# Patient Record
Sex: Female | Born: 1965 | Race: White | Hispanic: No | Smoking: Current every day smoker
Health system: Southern US, Community
[De-identification: ages and names within clinical notes are randomized; demographics above are authoritative.]

---

## 2005-06-25 ENCOUNTER — Emergency Department (HOSPITAL_COMMUNITY): Admission: EM | Admit: 2005-06-25 | Discharge: 2005-06-25 | Payer: Self-pay | Admitting: Emergency Medicine

## 2005-07-01 ENCOUNTER — Emergency Department (HOSPITAL_COMMUNITY): Admission: EM | Admit: 2005-07-01 | Discharge: 2005-07-01 | Payer: Self-pay | Admitting: Emergency Medicine

## 2011-02-19 ENCOUNTER — Emergency Department (HOSPITAL_COMMUNITY): Payer: Self-pay

## 2011-02-19 ENCOUNTER — Emergency Department (HOSPITAL_COMMUNITY)
Admission: EM | Admit: 2011-02-19 | Discharge: 2011-02-20 | Disposition: A | Discharge status: Home / Self Care | Payer: Self-pay | Attending: Emergency Medicine | Admitting: Emergency Medicine

## 2011-02-19 DIAGNOSIS — W268XXA Contact with other sharp object(s), not elsewhere classified, initial encounter: Secondary | ICD-10-CM | POA: Insufficient documentation

## 2011-02-19 DIAGNOSIS — S0990XA Unspecified injury of head, initial encounter: Secondary | ICD-10-CM | POA: Insufficient documentation

## 2011-02-19 DIAGNOSIS — S61409A Unspecified open wound of unspecified hand, initial encounter: Secondary | ICD-10-CM | POA: Insufficient documentation

## 2011-02-19 DIAGNOSIS — W01119A Fall on same level from slipping, tripping and stumbling with subsequent striking against unspecified sharp object, initial encounter: Secondary | ICD-10-CM | POA: Insufficient documentation

## 2011-02-19 LAB — POCT I-STAT, CHEM 8
BUN: 24 mg/dL — ABNORMAL HIGH (ref 6–23)
Creatinine, Ser: 1 mg/dL (ref 0.4–1.2)
Hemoglobin: 13.3 g/dL (ref 12.0–15.0)
Potassium: 3.1 mEq/L — ABNORMAL LOW (ref 3.5–5.1)
Sodium: 139 mEq/L (ref 135–145)
TCO2: 21 mmol/L (ref 0–100)

## 2011-02-20 ENCOUNTER — Emergency Department (HOSPITAL_COMMUNITY): Payer: Self-pay

## 2011-03-02 ENCOUNTER — Inpatient Hospital Stay (INDEPENDENT_AMBULATORY_CARE_PROVIDER_SITE_OTHER)
Admission: RE | Admit: 2011-03-02 | Discharge: 2011-03-02 | Disposition: A | Discharge status: Home / Self Care | Payer: Self-pay | Source: Ambulatory Visit

## 2011-03-02 DIAGNOSIS — T148XXA Other injury of unspecified body region, initial encounter: Secondary | ICD-10-CM

## 2011-03-02 DIAGNOSIS — S61409A Unspecified open wound of unspecified hand, initial encounter: Secondary | ICD-10-CM

## 2011-03-05 LAB — WOUND CULTURE: Gram Stain: NONE SEEN

## 2011-04-23 ENCOUNTER — Inpatient Hospital Stay (INDEPENDENT_AMBULATORY_CARE_PROVIDER_SITE_OTHER)
Admission: RE | Admit: 2011-04-23 | Discharge: 2011-04-23 | Disposition: A | Discharge status: Home / Self Care | Payer: Self-pay | Source: Ambulatory Visit | Attending: Family Medicine | Admitting: Family Medicine

## 2011-04-23 DIAGNOSIS — N390 Urinary tract infection, site not specified: Secondary | ICD-10-CM

## 2011-04-23 LAB — WET PREP, GENITAL

## 2011-04-23 LAB — POCT URINALYSIS DIP (DEVICE)
Ketones, ur: NEGATIVE mg/dL
Protein, ur: 100 mg/dL — AB
Specific Gravity, Urine: 1.015 (ref 1.005–1.030)
Urobilinogen, UA: 1 mg/dL (ref 0.0–1.0)
pH: 7 (ref 5.0–8.0)

## 2011-04-25 LAB — GC/CHLAMYDIA PROBE AMP, GENITAL
Chlamydia, DNA Probe: NEGATIVE
GC Probe Amp, Genital: NEGATIVE

## 2018-03-05 ENCOUNTER — Encounter: Payer: Self-pay | Admitting: Pediatric Intensive Care

## 2018-03-05 DIAGNOSIS — E139 Other specified diabetes mellitus without complications: Secondary | ICD-10-CM

## 2018-03-05 LAB — GLUCOSE, POCT (MANUAL RESULT ENTRY): POC GLUCOSE: 86 mg/dL (ref 70–99)

## 2018-03-29 NOTE — Congregational Nurse Program (Signed)
Congregational Nurse Program Note  Date of Encounter: 03/05/2018  Past Medical History: No past medical history on file.  Encounter Details: CNP Questionnaire - 03/05/18 1200      Questionnaire   Patient Status  Not Applicable    Race  Hispanic or Latino    Location Patient Served At  [employer]    Uninsured  Uninsured (NEW 1x/quarter)    Food  No food insecurities    Housing/Utilities  Yes, have permanent housing    Transportation  No transportation needs    Interpersonal Safety  Yes, feel physically and emotionally safe where you currently live    Medication  No medication insecurities    Medical Provider  No    Referrals  Not Applicable    ED Visit Averted  Not Applicable    Life-Saving Intervention Made  Not Applicable      Requests BP/BG check. States family histpry of diabetes and hypertension but no personal history.

## 2019-06-06 ENCOUNTER — Other Ambulatory Visit: Payer: Self-pay

## 2019-06-06 ENCOUNTER — Ambulatory Visit (HOSPITAL_COMMUNITY)
Admission: EM | Admit: 2019-06-06 | Discharge: 2019-06-06 | Disposition: A | Discharge status: Home / Self Care | Payer: Self-pay | Attending: Internal Medicine | Admitting: Internal Medicine

## 2019-06-06 ENCOUNTER — Encounter (HOSPITAL_COMMUNITY): Payer: Self-pay | Admitting: Emergency Medicine

## 2019-06-06 DIAGNOSIS — R509 Fever, unspecified: Secondary | ICD-10-CM

## 2019-06-06 DIAGNOSIS — M791 Myalgia, unspecified site: Secondary | ICD-10-CM

## 2019-06-06 DIAGNOSIS — Z20828 Contact with and (suspected) exposure to other viral communicable diseases: Secondary | ICD-10-CM | POA: Insufficient documentation

## 2019-06-06 DIAGNOSIS — Z20822 Contact with and (suspected) exposure to covid-19: Secondary | ICD-10-CM

## 2019-06-06 DIAGNOSIS — R059 Cough, unspecified: Secondary | ICD-10-CM

## 2019-06-06 DIAGNOSIS — F17200 Nicotine dependence, unspecified, uncomplicated: Secondary | ICD-10-CM | POA: Insufficient documentation

## 2019-06-06 DIAGNOSIS — R05 Cough: Secondary | ICD-10-CM | POA: Insufficient documentation

## 2019-06-06 MED ORDER — BENZONATATE 100 MG PO CAPS: 100.0000 mg | ORAL_CAPSULE | Freq: Three times a day (TID) | ORAL | 0 refills | Status: AC | PRN

## 2019-06-06 MED ORDER — ALBUTEROL SULFATE HFA 108 (90 BASE) MCG/ACT IN AERS: 1.0000 | INHALATION_SPRAY | Freq: Four times a day (QID) | RESPIRATORY_TRACT | 0 refills | Status: AC | PRN

## 2019-06-06 MED ORDER — BENZONATATE 100 MG PO CAPS: 100.0000 mg | ORAL_CAPSULE | Freq: Three times a day (TID) | ORAL | 0 refills | Status: DC

## 2019-06-06 NOTE — ED Triage Notes (Signed)
Pt sts body aches x several days worse today; pt admits to last crack cocaine use 3 days ago; pt tearful

## 2019-06-06 NOTE — ED Provider Notes (Signed)
[place]    CSN: [id number] Arrival date & time: 06/06/19  1815      History   Chief Complaint Chief Complaint  Patient presents with  . Generalized Body Aches    HPI Jessica Flynn is a 53 y.o. female with no past medical history comes in with nonproductive cough, generalized body aches of several days duration.  Patient has subjective fever and chills.  She admits to having intermittent wheezing.  Cough was previously productive of some greenish sputum but that has resolved at this time.  She denies any shortness of breath, nausea or vomiting.  Patient denies any relieving factors.  She has not tried any over-the-counter medication.  She uses street drugs and use crack cocaine/marijuana 3 days ago. HPI  History reviewed. No pertinent past medical history.  There are no active problems to display for this patient.   History reviewed. No pertinent surgical history.  OB History   No obstetric history on file.      Home Medications    Prior to Admission medications   Medication Sig Start Date End Date Taking? Authorizing Provider  albuterol (VENTOLIN HFA) 108 (90 Base) MCG/ACT inhaler Inhale 1-2 puffs into the lungs every 6 (six) hours as needed for wheezing or shortness of breath. 06/06/19   Walid Haig, Myrene Galas, MD  benzonatate (TESSALON) 100 MG capsule Take 1 capsule (100 mg total) by mouth 3 (three) times daily as needed for cough. 06/06/19   Klint Lezcano, Myrene Galas, MD    Family History Family History  Family history unknown: Yes    Social History Social History   Tobacco Use  . Smoking status: Current Every Day Smoker  . Smokeless tobacco: Never Used  Substance Use Topics  . Alcohol use: Yes  . Drug use: Yes    Types: Cocaine, Marijuana     Allergies   Patient has no known allergies.   Review of Systems Review of Systems  Constitutional: Positive for activity change, chills, fatigue and fever. Negative for appetite change and unexpected weight  change.  HENT: Negative for congestion, facial swelling, hearing loss, mouth sores, postnasal drip, rhinorrhea, sneezing and sore throat.   Respiratory: Positive for cough, chest tightness and wheezing. Negative for shortness of breath.   Gastrointestinal: Negative.   Genitourinary: Negative.   Musculoskeletal: Positive for arthralgias and myalgias. Negative for back pain, gait problem and joint swelling.  Skin: Negative.   Neurological: Positive for headaches. Negative for dizziness and weakness.     Physical Exam Triage Vital Signs ED Triage Vitals  Enc Vitals Group     BP 06/06/19 1854 (!) 144/107     Pulse Rate 06/06/19 1854 88     Resp 06/06/19 1854 18     Temp 06/06/19 1854 98.5 F (36.9 C)     Temp Source 06/06/19 1854 Oral     SpO2 06/06/19 1854 100 %     Weight --      Height --      Head Circumference --      Peak Flow --      Pain Score 06/06/19 1855 7     Pain Loc --      Pain Edu? --      Excl. in [place]? --    No data found.  Updated Vital Signs BP (!) 144/107 (BP Location: Right Arm)   Pulse 88   Temp 98.5 F (36.9 C) (Oral)   Resp 18   SpO2 100%   Visual Acuity Right  Eye Distance:   Left Eye Distance:   Bilateral Distance:    Right Eye Near:   Left Eye Near:    Bilateral Near:     Physical Exam Constitutional:      General: She is in acute distress.     Appearance: She is ill-appearing. She is not toxic-appearing.  HENT:     Mouth/Throat:     Mouth: Mucous membranes are moist.  Cardiovascular:     Rate and Rhythm: Normal rate and regular rhythm.     Pulses: Normal pulses.     Heart sounds: Normal heart sounds.  Pulmonary:     Effort: Pulmonary effort is normal. No respiratory distress.     Breath sounds: Normal breath sounds. No wheezing, rhonchi or rales.  Abdominal:     General: Bowel sounds are normal.     Palpations: Abdomen is soft.  Musculoskeletal: Normal range of motion.  Skin:    General: Skin is warm.     Capillary Refill:  Capillary refill takes less than 2 seconds.  Neurological:     General: No focal deficit present.     Mental Status: She is alert.      UC Treatments / Results  Labs (all labs ordered are listed, but only abnormal results are displayed) Labs Reviewed - No data to display  EKG   Radiology No results found.  Procedures Procedures (including critical care time)  Medications Ordered in UC Medications - No data to display  Initial Impression / Assessment and Plan / UC Course  I have reviewed the triage vital signs and the nursing notes.  Pertinent labs & imaging results that were available during my care of the patient were reviewed by me and considered in my medical decision making (see chart for details).     1.  Flulike symptoms: COVID-19 testing completed Patient is advised to self isolate Tessalon Perles as needed for cough Tylenol/Motrin as needed for body aches  2.  Chronic tobacco use with suspected COPD: Albuterol inhaler as needed Smoke cessation advice given Patient is in the pre-contemplative state of tobacco cessation  3.  Illicit drug use: Patient was counseled about the dangers of using street drugs. Final Clinical Impressions(s) / UC Diagnoses   Final diagnoses:  Suspected Covid-19 Virus Infection  Cough   Discharge Instructions   None    ED Prescriptions    Medication Sig Dispense Auth. Provider   albuterol (VENTOLIN HFA) 108 (90 Base) MCG/ACT inhaler Inhale 1-2 puffs into the lungs every 6 (six) hours as needed for wheezing or shortness of breath. 6.7 g Brittan Butterbaugh, Britta MccreedyPhilip O, MD   benzonatate (TESSALON) 100 MG capsule  (Status: Discontinued) Take 1 capsule (100 mg total) by mouth every 8 (eight) hours. 21 capsule Solangel Mcmanaway, Britta MccreedyPhilip O, MD   benzonatate (TESSALON) 100 MG capsule Take 1 capsule (100 mg total) by mouth 3 (three) times daily as needed for cough. 21 capsule Shivali Quackenbush, Britta MccreedyPhilip O, MD     Controlled Substance Prescriptions [place] Controlled  Substance Registry consulted? No   Merrilee JanskyLamptey, Aleigh Grunden O, MD 06/06/19 2036

## 2019-06-09 LAB — NOVEL CORONAVIRUS, NAA (HOSP ORDER, SEND-OUT TO REF LAB; TAT 18-24 HRS): SARS-CoV-2, NAA: NOT DETECTED

## 2020-02-20 ENCOUNTER — Ambulatory Visit: Payer: Self-pay | Attending: Internal Medicine

## 2020-02-20 DIAGNOSIS — Z23 Encounter for immunization: Secondary | ICD-10-CM

## 2020-02-20 NOTE — Progress Notes (Signed)
   Covid-19 Vaccination Clinic  Name:  Xylina Rhoads    MRN: [id number] DOB: [birth year]  02/20/2020  Ms. Seki was observed post Covid-19 immunization for 15 minutes without incident. She was provided with Vaccine Information Sheet and instruction to access the V-Safe system.   Ms. Zia was instructed to call 911 with any severe reactions post vaccine: Marland Kitchen Difficulty breathing  . Swelling of face and throat  . A fast heartbeat  . A bad rash all over body  . Dizziness and weakness   Immunizations Administered    Name Date Dose VIS Date Route   Moderna COVID-19 Vaccine 02/20/2020 10:05 AM 0.5 mL 10/22/2019 Intramuscular   Manufacturer: Moderna   Lot: [id number]   NDC: 98421-031-28

## 2020-03-24 ENCOUNTER — Ambulatory Visit: Payer: Self-pay | Attending: Family

## 2020-03-24 DIAGNOSIS — Z23 Encounter for immunization: Secondary | ICD-10-CM

## 2020-03-24 NOTE — Progress Notes (Signed)
   Covid-19 Vaccination Clinic  Name:  Jessica Flynn    MRN: [id number] DOB: 1966/08/04  03/24/2020  [name] was observed post Covid-19 immunization for 15 minutes without incident. She was provided with Vaccine Information Sheet and instruction to access the V-Safe system.   [name] was instructed to call 911 with any severe reactions post vaccine: Marland Kitchen Difficulty breathing  . Swelling of face and throat  . A fast heartbeat  . A bad rash all over body  . Dizziness and weakness   Immunizations Administered    Name Date Dose VIS Date Route   Moderna COVID-19 Vaccine 03/24/2020 11:01 AM 0.5 mL 10/2019 Intramuscular   Manufacturer: Moderna   Lot: [id number]   NDC: 83338-329-19

## 2021-07-23 ENCOUNTER — Encounter (HOSPITAL_COMMUNITY): Payer: Self-pay

## 2021-07-23 ENCOUNTER — Emergency Department (HOSPITAL_COMMUNITY)
Admission: EM | Admit: 2021-07-23 | Discharge: 2021-07-23 | Disposition: A | Discharge status: Home / Self Care | Payer: Self-pay | Attending: Emergency Medicine | Admitting: Emergency Medicine

## 2021-07-23 DIAGNOSIS — L03115 Cellulitis of right lower limb: Secondary | ICD-10-CM

## 2021-07-23 DIAGNOSIS — R2241 Localized swelling, mass and lump, right lower limb: Secondary | ICD-10-CM | POA: Insufficient documentation

## 2021-07-23 DIAGNOSIS — F17210 Nicotine dependence, cigarettes, uncomplicated: Secondary | ICD-10-CM | POA: Insufficient documentation

## 2021-07-23 MED ORDER — DOXYCYCLINE HYCLATE 100 MG PO CAPS: 100.0000 mg | ORAL_CAPSULE | Freq: Two times a day (BID) | ORAL | 0 refills | Status: AC

## 2021-07-23 MED ORDER — DOXYCYCLINE HYCLATE 100 MG PO TABS
100.0000 mg | ORAL_TABLET | Freq: Once | ORAL | Status: AC
  Administered 2021-07-23: 100 mg via ORAL
  Filled 2021-07-23: qty 1

## 2021-07-23 NOTE — ED Triage Notes (Signed)
Pt BIB [facility] from home c/o possible spider bite on R knee 2 days ago. Reports increased swelling, pain, difficulty walking.   BP 138/78 HR 76 SpO2 98% RA Temp 98.4

## 2021-07-23 NOTE — Discharge Instructions (Addendum)
You have cellulitis of the right knee.  Take doxycycline twice daily for the next 10 days.  Start taking it this evening were given the first dose here in the ED today.  If the redness spreads past the outline we drew please return back to the ED for additional evaluation.  Please also set up an appointment to follow-up with a primary care physician.  If you attach information to the discharge paperwork.

## 2021-07-23 NOTE — ED Provider Notes (Signed)
[facility]-EMERGENCY DEPT Provider Note   CSN: [id number] Arrival date & time: 07/23/21  1194     History Chief Complaint  Patient presents with   Insect Bite    Jessica Flynn is a 55 y.o. female.  HPI  Patient presents with swelling to the right knee.  States that she was sitting on the front porch yesterday and noticed a spider crawling on her leg.  She felt a stinging pressure on her knee.  States that it is been increasingly swelling, red, and painful.  The redness is spreading down the anterior surface of her shin.  She does not have any fevers at home.  She has not tried any alleviating factors.  Walking is an aggravating factor.  History reviewed. No pertinent past medical history.  There are no problems to display for this patient.   History reviewed. No pertinent surgical history.   OB History   No obstetric history on file.     Family History  Family history unknown: Yes    Social History   Tobacco Use   Smoking status: Every Day    Packs/day: 1.00    Years: 40.00    Pack years: 40.00    Types: Cigarettes   Smokeless tobacco: Never  Substance Use Topics   Alcohol use: Not Currently   Drug use: Not Currently    Types: Cocaine, Marijuana    Comment: clean for 4-5 months    Home Medications Prior to Admission medications   Medication Sig Start Date End Date Taking? Authorizing Provider  albuterol (VENTOLIN HFA) 108 (90 Base) MCG/ACT inhaler Inhale 1-2 puffs into the lungs every 6 (six) hours as needed for wheezing or shortness of breath. 06/06/19   Lamptey, Britta Mccreedy, MD  benzonatate (TESSALON) 100 MG capsule Take 1 capsule (100 mg total) by mouth 3 (three) times daily as needed for cough. 06/06/19   Lamptey, Britta Mccreedy, MD    Allergies    Patient has no known allergies.  Review of Systems   Review of Systems  Constitutional:  Negative for fatigue and fever.  Musculoskeletal:  Positive for arthralgias.  Skin:  Positive for color  change and rash.   Physical Exam Updated Vital Signs Ht 5\' 1"  (1.549 m)   Wt 49.9 kg   BMI 20.78 kg/m   Physical Exam Vitals and nursing note reviewed. Exam conducted with a chaperone present.  Constitutional:      General: She is not in acute distress.    Appearance: Normal appearance.  HENT:     Head: Normocephalic and atraumatic.  Eyes:     General: No scleral icterus.    Extraocular Movements: Extraocular movements intact.     Pupils: Pupils are equal, round, and reactive to light.  Cardiovascular:     Rate and Rhythm: Normal rate and regular rhythm.     Comments: DP and PT are 2+ bilaterally Musculoskeletal:        General: Tenderness present. Normal range of motion.     Comments: Range of motion fully intact passive flexion extension.    Skin:    Coloration: Skin is not jaundiced.     Comments: Please see attached photo.  Patient has warmth, erythema, mild swelling to the right leg and right knee.  Neurological:     General: No focal deficit present.     Mental Status: She is alert. Mental status is at baseline.     Coordination: Coordination normal.     ED Results /  Procedures / Treatments   Labs (all labs ordered are listed, but only abnormal results are displayed) Labs Reviewed - No data to display  EKG None  Radiology No results found.  Procedures Procedures   Medications Ordered in ED Medications - No data to display  ED Course  I have reviewed the triage vital signs and the nursing notes.  Pertinent labs & imaging results that were available during my care of the patient were reviewed by me and considered in my medical decision making (see chart for details).    MDM Rules/Calculators/A&P                           Patient vitals are stable, she is neurovascularly intact to the right leg.  Physical exam findings are consistent with cellulitis.  No posterior calf tenderness, physical exam is also not consistent with a DVT.  We will start the  patient on doxycycline twice daily for the next 10 days.  First dose given here in the ED setting, patient also given primary care doctor referral as she does not currently have one.  Strict return precautions were given, erythema was delineated circumferentially with a pen so she is able to track its improvement or worsening.  Final Clinical Impression(s) / ED Diagnoses Final diagnoses:  None    Rx / DC Orders ED Discharge Orders     None        Theron Arista, PA-C 07/23/21 1194    Virgina Norfolk, DO 07/23/21 1012

## 2021-11-13 ENCOUNTER — Emergency Department (HOSPITAL_COMMUNITY): Payer: Self-pay

## 2021-11-13 ENCOUNTER — Emergency Department (HOSPITAL_COMMUNITY)
Admission: EM | Admit: 2021-11-13 | Discharge: 2021-11-13 | Disposition: A | Discharge status: Home / Self Care | Payer: Self-pay | Attending: Emergency Medicine | Admitting: Emergency Medicine

## 2021-11-13 DIAGNOSIS — F17210 Nicotine dependence, cigarettes, uncomplicated: Secondary | ICD-10-CM | POA: Insufficient documentation

## 2021-11-13 DIAGNOSIS — S0003XA Contusion of scalp, initial encounter: Secondary | ICD-10-CM

## 2021-11-13 DIAGNOSIS — Y92009 Unspecified place in unspecified non-institutional (private) residence as the place of occurrence of the external cause: Secondary | ICD-10-CM | POA: Insufficient documentation

## 2021-11-13 DIAGNOSIS — T1490XA Injury, unspecified, initial encounter: Secondary | ICD-10-CM

## 2021-11-13 DIAGNOSIS — W228XXA Striking against or struck by other objects, initial encounter: Secondary | ICD-10-CM | POA: Insufficient documentation

## 2021-11-13 LAB — I-STAT CHEM 8, ED
BUN: 16 mg/dL (ref 6–20)
Calcium, Ion: 1.12 mmol/L — ABNORMAL LOW (ref 1.15–1.40)
Chloride: 105 mmol/L (ref 98–111)
Creatinine, Ser: 0.4 mg/dL — ABNORMAL LOW (ref 0.44–1.00)
Glucose, Bld: 70 mg/dL (ref 70–99)
HCT: 46 % (ref 36.0–46.0)
Hemoglobin: 15.6 g/dL — ABNORMAL HIGH (ref 12.0–15.0)
Potassium: 3.9 mmol/L (ref 3.5–5.1)
Sodium: 139 mmol/L (ref 135–145)
TCO2: 25 mmol/L (ref 22–32)

## 2021-11-13 LAB — CBC
HCT: 44.5 % (ref 36.0–46.0)
Hemoglobin: 15.5 g/dL — ABNORMAL HIGH (ref 12.0–15.0)
MCH: 31.1 pg (ref 26.0–34.0)
MCHC: 34.8 g/dL (ref 30.0–36.0)
MCV: 89.4 fL (ref 80.0–100.0)
Platelets: 254 10*3/uL (ref 150–400)
RBC: 4.98 MIL/uL (ref 3.87–5.11)
RDW: 14.3 % (ref 11.5–15.5)
WBC: 6.1 10*3/uL (ref 4.0–10.5)
nRBC: 0 % (ref 0.0–0.2)

## 2021-11-13 LAB — COMPREHENSIVE METABOLIC PANEL
ALT: 24 U/L (ref 0–44)
AST: 32 U/L (ref 15–41)
Albumin: 3.6 g/dL (ref 3.5–5.0)
Alkaline Phosphatase: 82 U/L (ref 38–126)
Anion gap: 9 (ref 5–15)
BUN: 13 mg/dL (ref 6–20)
CO2: 23 mmol/L (ref 22–32)
Calcium: 8.6 mg/dL — ABNORMAL LOW (ref 8.9–10.3)
Chloride: 106 mmol/L (ref 98–111)
Creatinine, Ser: 0.56 mg/dL (ref 0.44–1.00)
GFR, Estimated: >60 mL/min (ref >60)
Glucose, Bld: 74 mg/dL (ref 70–99)
Potassium: 3.8 mmol/L (ref 3.5–5.1)
Sodium: 138 mmol/L (ref 135–145)
Total Bilirubin: 0.5 mg/dL (ref 0.3–1.2)
Total Protein: 6.5 g/dL (ref 6.5–8.1)

## 2021-11-13 MED ORDER — SODIUM CHLORIDE 0.9 % IV BOLUS
1000.0000 mL | Freq: Once | INTRAVENOUS | Status: AC
  Administered 2021-11-13: 03:00:00 1000 mL via INTRAVENOUS

## 2021-11-13 MED ORDER — FENTANYL CITRATE PF 50 MCG/ML IJ SOSY
50.0000 ug | PREFILLED_SYRINGE | Freq: Once | INTRAMUSCULAR | Status: AC
  Administered 2021-11-13: 03:00:00 50 ug via INTRAVENOUS
  Filled 2021-11-13: qty 1
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
## 2021-11-13 NOTE — Discharge Instructions (Addendum)
° °  During the workup we noted incidental findings on your imaging that would require you to follow-up with your regular doctor for further evaluation/management: 5 mm subpleural nodule in the left apex. No follow-up needed if  patient is low-risk. Non-contrast chest CT can be considered in 12  months if patient is high-risk. This recommendation follows the  consensus statement: Guidelines for Management of Incidental  Pulmonary Nodules Detected on CT Images: From the Fleischner Society  2017; Radiology 2017; 284:228-243.
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
## 2024-09-23 ENCOUNTER — Ambulatory Visit (HOSPITAL_COMMUNITY)
Admission: EM | Admit: 2024-09-23 | Discharge: 2024-09-23 | Discharge status: Against Medical Advice | Payer: Self-pay | Attending: Family Medicine | Admitting: Family Medicine

## 2024-09-23 ENCOUNTER — Encounter (HOSPITAL_COMMUNITY): Payer: Self-pay | Admitting: Emergency Medicine

## 2024-09-23 DIAGNOSIS — R9431 Abnormal electrocardiogram [ECG] [EKG]: Secondary | ICD-10-CM

## 2024-09-23 DIAGNOSIS — R079 Chest pain, unspecified: Secondary | ICD-10-CM

## 2024-09-23 MED ORDER — ASPIRIN 81 MG PO CHEW
CHEWABLE_TABLET | ORAL | Status: AC
  Filled 2024-09-23: qty 4

## 2024-09-23 MED ORDER — ASPIRIN 81 MG PO CHEW
324.0000 mg | CHEWABLE_TABLET | Freq: Once | ORAL | Status: AC
  Administered 2024-09-23: 324 mg via ORAL

## 2024-09-23 MED ORDER — ASPIRIN 325 MG PO TBEC: 325.0000 mg | DELAYED_RELEASE_TABLET | Freq: Every day | ORAL | Status: DC

## 2024-09-23 NOTE — ED Notes (Addendum)
 Pt signed AMA form, placed in Folder for HIM to scan in chart

## 2024-09-23 NOTE — ED Notes (Signed)
 Called spoke to Personnel Officer
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
## 2024-09-23 NOTE — ED Notes (Signed)
 Patient is being discharged from the Urgent Care and sent to the Emergency Department via [facility]. Per Myla Bold, NP, patient is in need of higher level of care due to chest pain with EKG changes. Patient is aware and verbalizes understanding of plan of care.  Vitals:   09/23/24 1356  BP: 102/69  Pulse: 60  Resp: 18  Temp: 98.3 F (36.8 C)  SpO2: 100%

## 2024-09-23 NOTE — ED Triage Notes (Signed)
 Patient c/o chest tightness and burning since last night.  Some dizziness and sweatiness then it will pass, some SOB.  Denies any OTC meds.  No cardiac history.

## 2024-09-23 NOTE — ED Notes (Addendum)
 Called charge RN, no answer.

## 2024-09-23 NOTE — ED Notes (Signed)
 Called [facility] per Debby no trucks call EMS. Called EMS they will send truck.

## 2024-09-23 NOTE — Discharge Instructions (Addendum)
 Pt declined EMS and signed out [facility]
